# Patient Record
Sex: Male | Born: 1960 | Race: White | Hispanic: No | Marital: Single | Smoking: Never smoker
Health system: Southern US, Community
[De-identification: ages and names within clinical notes are randomized; demographics above are authoritative.]

---

## 2009-09-13 ENCOUNTER — Emergency Department: Payer: Self-pay | Admitting: Emergency Medicine

## 2010-11-23 ENCOUNTER — Emergency Department: Payer: Self-pay | Admitting: Emergency Medicine

## 2010-12-14 ENCOUNTER — Emergency Department: Payer: Self-pay | Admitting: Internal Medicine

## 2011-11-09 ENCOUNTER — Emergency Department: Payer: Self-pay | Admitting: Emergency Medicine

## 2011-11-16 ENCOUNTER — Emergency Department: Payer: Self-pay | Admitting: Emergency Medicine

## 2011-12-07 ENCOUNTER — Emergency Department: Payer: Self-pay | Admitting: Emergency Medicine

## 2012-09-01 IMAGING — CR PELVIS - 1-2 VIEW
1 series · 1 of 1 positions shown · non-contrast
Comparison: none

REASON FOR EXAM: pain rule out foreign body
COMMENTS:   May transport without cardiac monitor

PROCEDURE:     DXR - DXR PELVIS AP ONLY  - November 24, 2010  [DATE]
RESULT:     An AP view of the pelvis shows no significant osseous
abnormalities. No radiopaque foreign body is seen. There is a mild to
moderate amount of fecal material in the colon.

[view not recorded]
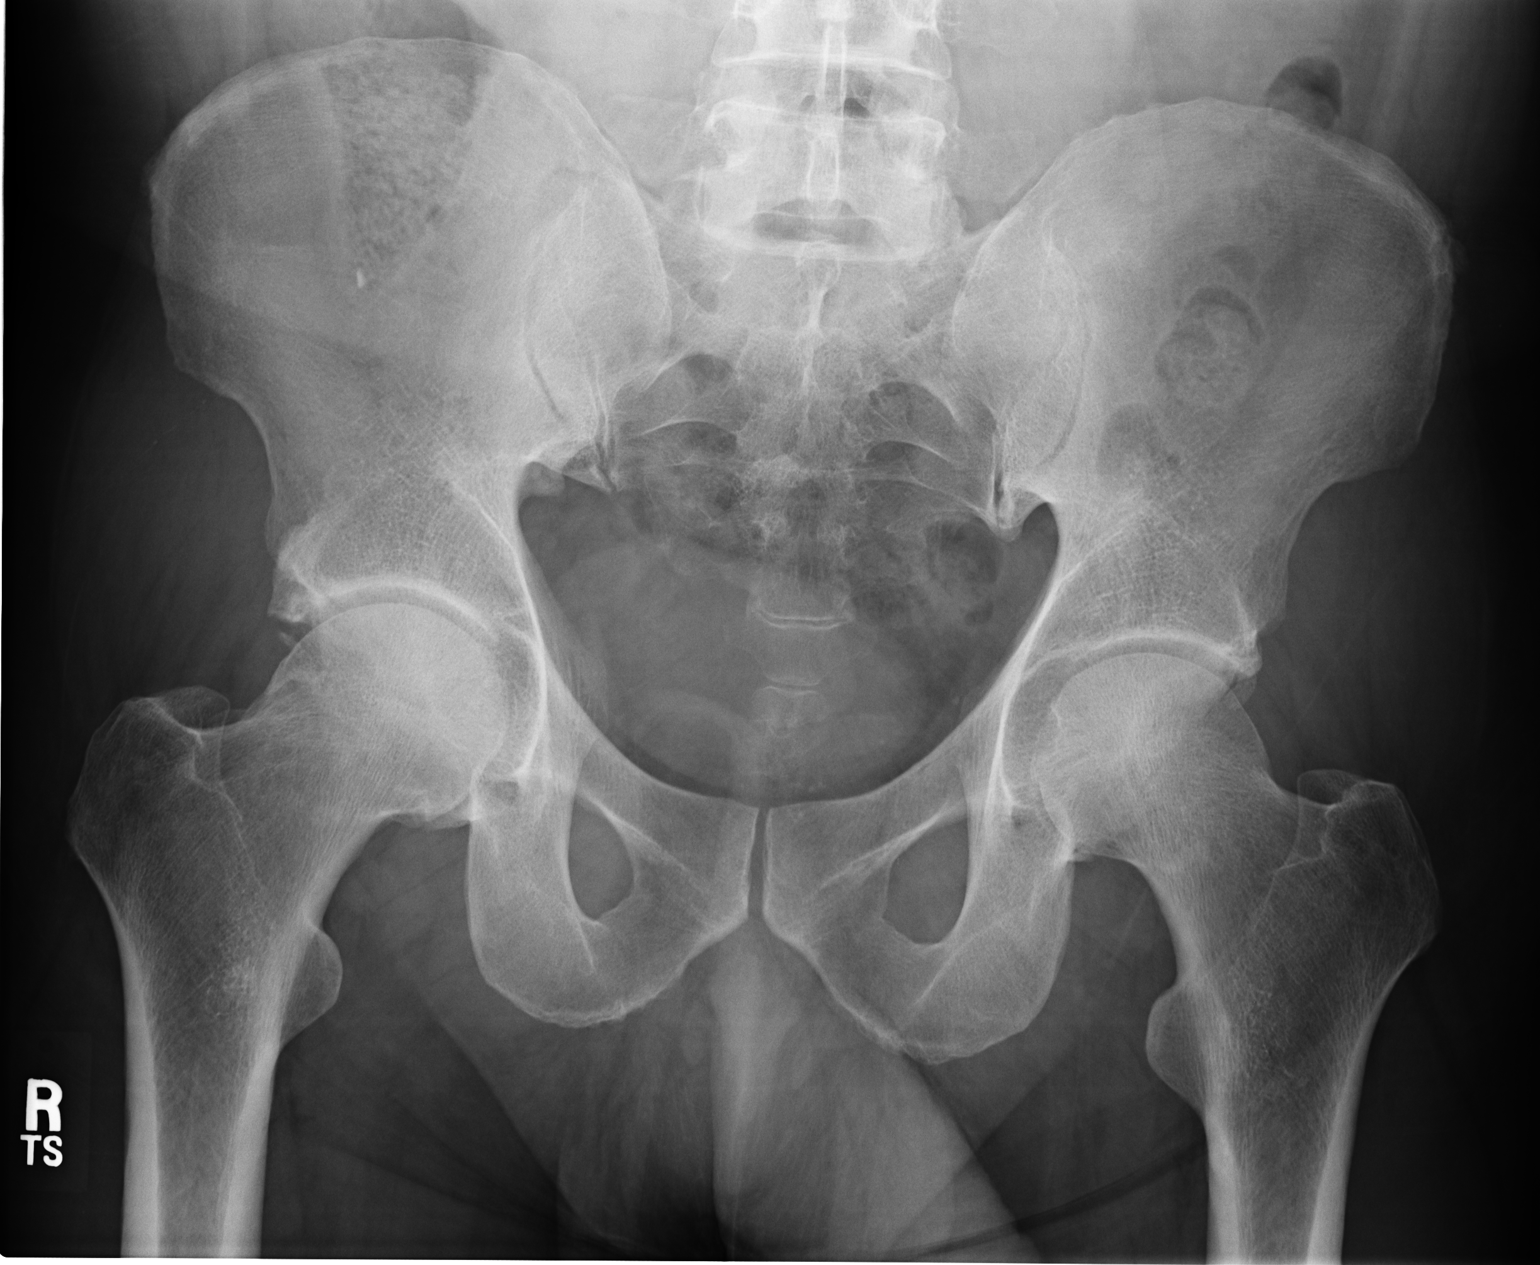

[1 of 1 positions shown; findings below may reference images not displayed]

IMPRESSION: 1.  No foreign body is seen.
2.  No acute changes are identified.

## 2013-07-16 ENCOUNTER — Emergency Department: Payer: Self-pay | Admitting: Emergency Medicine

## 2015-01-26 ENCOUNTER — Encounter: Payer: Self-pay | Admitting: Medical Oncology

## 2015-01-26 ENCOUNTER — Emergency Department: Payer: Self-pay

## 2015-01-26 ENCOUNTER — Emergency Department
Admission: EM | Admit: 2015-01-26 | Discharge: 2015-01-26 | Disposition: A | Discharge status: Home / Self Care | Payer: Self-pay | Attending: Emergency Medicine | Admitting: Emergency Medicine

## 2015-01-26 DIAGNOSIS — W228XXA Striking against or struck by other objects, initial encounter: Secondary | ICD-10-CM | POA: Insufficient documentation

## 2015-01-26 DIAGNOSIS — Y9289 Other specified places as the place of occurrence of the external cause: Secondary | ICD-10-CM | POA: Insufficient documentation

## 2015-01-26 DIAGNOSIS — Y9389 Activity, other specified: Secondary | ICD-10-CM | POA: Insufficient documentation

## 2015-01-26 DIAGNOSIS — Y998 Other external cause status: Secondary | ICD-10-CM | POA: Insufficient documentation

## 2015-01-26 DIAGNOSIS — S8012XA Contusion of left lower leg, initial encounter: Secondary | ICD-10-CM | POA: Insufficient documentation

## 2015-01-26 NOTE — Discharge Instructions (Signed)

## 2015-01-26 NOTE — ED Notes (Signed)
Pt ambulatory to triage with reports that he hit his rt lower leg on a trailer hitch, continues to have swelling to area. Denies pain. Pulse present in foot.

## 2015-01-26 NOTE — ED Notes (Signed)
PA reviewed D/C instructions with patient. Pt left prior to nurse being able to D/C pt or get signature. PA states NAD noted at time of D/C.

## 2015-01-26 NOTE — ED Provider Notes (Signed)
[facility] Emergency Department Provider Note  ____________________________________________  Time seen: Approximately 11:03 AM  I have reviewed the triage vital signs and the nursing notes.   HISTORY  Chief Complaint Leg Injury    HPI Mike Morales is a 54 y.o. male presents with continuous right lower leg discomfort and swelling after hitting it on a trailer hitch approximately 2 weeks ago pain is not significantly is able to work and walk on it without pain there is a a 3 cm nummular area that is raised fluctuant and tender. No ecchymosis or bruising noted no erythema.  History reviewed. No pertinent past medical history.  There are no active problems to display for this patient.   History reviewed. No pertinent past surgical history.  No current outpatient prescriptions on file.  Allergies Review of patient's allergies indicates no known allergies.  No family history on file.  Social History History  Substance Use Topics  . Smoking status: Never Smoker   . Smokeless tobacco: Not on file  . Alcohol Use: Yes     Comment: weekly    Review of Systems Constitutional: No fever/chills Eyes: No visual changes. ENT: No sore throat. Cardiovascular: Denies chest pain. Respiratory: Denies shortness of breath. Gastrointestinal: No abdominal pain.  No nausea, no vomiting.  No diarrhea.  No constipation. Genitourinary: Negative for dysuria. Musculoskeletal: Positive for right lower leg contusion. Skin: Negative for rash. Neurological: Negative for headaches, focal weakness or numbness.  10-point ROS otherwise negative.  ____________________________________________   PHYSICAL EXAM:  VITAL SIGNS: ED Triage Vitals  Enc Vitals Group     BP 01/26/15 1046 133/86 mmHg     Pulse Rate 01/26/15 1046 71     Resp 01/26/15 1046 18     Temp 01/26/15 1046 97.5 F (36.4 C)     Temp Source 01/26/15 1046 Oral     SpO2 01/26/15 1046 97 %     Weight  01/26/15 1046 200 lb (90.719 kg)     Height 01/26/15 1046 6' (1.829 m)     Head Cir --      Peak Flow --      Pain Score --      Pain Loc --      Pain Edu? --      Excl. in GC? --     Constitutional: Alert and oriented. Well appearing and in no acute distress. Eyes: Conjunctivae are normal. PERRL. EOMI. Head: Atraumatic. Nose: No congestion/rhinnorhea. Mouth/Throat: Mucous membranes are moist.  Oropharynx non-erythematous. Neck: No stridor.  Cardiovascular: Normal rate, regular rhythm. Grossly normal heart sounds.  Good peripheral circulation. Respiratory: Normal respiratory effort.  No retractions. Lungs CTAB. Gastrointestinal: Soft and nontender. No distention. No abdominal bruits. No CVA tenderness. Musculoskeletal: Right anterior tib-fib. There is a 3 cm area of raised fluctuant tender mass. Consistent with hemarthrosis. Neurologic:  Normal speech and language. No gross focal neurologic deficits are appreciated. Speech is normal. No gait instability. Skin:  Skin is warm, dry and intact. No rash noted. Psychiatric: Mood and affect are normal. Speech and behavior are normal.  ____________________________________________   LABS (all labs ordered are listed, but only abnormal results are displayed)  Labs Reviewed - No data to display ____________________________________________  EKG Not applicable ____________________________________________  RADIOLOGY  Hematoma interpreted by radiologist reviewed by myself. Negative fracture ____________________________________________   PROCEDURES  Procedure(s) performed: None  Critical Care performed: No  ____________________________________________   INITIAL IMPRESSION / ASSESSMENT AND PLAN / ED COURSE  Pertinent labs &  imaging results that were available during my care of the patient were reviewed by me and considered in my medical decision making (see chart for details).  Reassurance provided to the patient handout given  patient voices understanding and will follow-up if needed. No other treatment needed at this time. ____________________________________________   FINAL CLINICAL IMPRESSION(S) / ED DIAGNOSES  Final diagnoses:  Hematoma of leg, left, initial encounter      Evangeline Dakin, PA-C 01/26/15 1233  Minna Antis, MD 01/26/15 1453

## 2015-02-23 ENCOUNTER — Emergency Department: Payer: Self-pay

## 2015-02-23 ENCOUNTER — Encounter: Payer: Self-pay | Admitting: Emergency Medicine

## 2015-02-23 ENCOUNTER — Emergency Department
Admission: EM | Admit: 2015-02-23 | Discharge: 2015-02-23 | Disposition: A | Discharge status: Home / Self Care | Payer: Self-pay | Attending: Emergency Medicine | Admitting: Emergency Medicine

## 2015-02-23 DIAGNOSIS — M722 Plantar fascial fibromatosis: Secondary | ICD-10-CM | POA: Insufficient documentation

## 2015-02-23 MED ORDER — DICLOFENAC SODIUM 75 MG PO TBEC: 75.0000 mg | DELAYED_RELEASE_TABLET | Freq: Two times a day (BID) | ORAL | Status: DC

## 2015-02-23 NOTE — Discharge Instructions (Signed)
Plantar Fasciitis  Plantar fasciitis is a common condition that causes foot pain. It is soreness (inflammation) of the band of tough fibrous tissue on the bottom of the foot that runs from the heel bone (calcaneus) to the ball of the foot. The cause of this soreness may be from excessive standing, poor fitting shoes, running on hard surfaces, being overweight, having an abnormal walk, or overuse (this is common in runners) of the painful foot or feet. It is also common in aerobic exercise dancers and ballet dancers.  SYMPTOMS   Most people with plantar fasciitis complain of:   Severe pain in the morning on the bottom of their foot especially when taking the first steps out of bed. This pain recedes after a few minutes of walking.   Severe pain is experienced also during walking following a long period of inactivity.   Pain is worse when walking barefoot or up stairs  DIAGNOSIS    Your caregiver will diagnose this condition by examining and feeling your foot.   Special tests such as X-rays of your foot, are usually not needed.  PREVENTION    Consult a sports medicine professional before beginning a new exercise program.   Walking programs offer a good workout. With walking there is a lower chance of overuse injuries common to runners. There is less impact and less jarring of the joints.   Begin all new exercise programs slowly. If problems or pain develop, decrease the amount of time or distance until you are at a comfortable level.   Wear good shoes and replace them regularly.   Stretch your foot and the heel cords at the back of the ankle (Achilles tendon) both before and after exercise.   Run or exercise on even surfaces that are not hard. For example, asphalt is better than pavement.   Do not run barefoot on hard surfaces.   If using a treadmill, vary the incline.   Do not continue to workout if you have foot or joint problems. Seek professional help if they do not improve.  HOME CARE INSTRUCTIONS     Avoid activities that cause you pain until you recover.   Use ice or cold packs on the problem or painful areas after working out.   Only take over-the-counter or prescription medicines for pain, discomfort, or fever as directed by your caregiver.   Soft shoe inserts or athletic shoes with air or gel sole cushions may be helpful.   If problems continue or become more severe, consult a sports medicine caregiver or your own health care provider. Cortisone is a potent anti-inflammatory medication that may be injected into the painful area. You can discuss this treatment with your caregiver.  MAKE SURE YOU:    Understand these instructions.   Will watch your condition.   Will get help right away if you are not doing well or get worse.  Document Released: 03/30/2001 Document Revised: 09/27/2011 Document Reviewed: 05/29/2008  ExitCare Patient Information 2015 [employer]. This information is not intended to replace advice given to you by your health care provider. Make sure you discuss any questions you have with your health care provider.

## 2015-02-23 NOTE — ED Provider Notes (Signed)
[facility] Emergency Department Provider Note  ____________________________________________  Time seen:  12:21 PM  I have reviewed the triage vital signs and the nursing notes.   HISTORY  Chief Complaint Foot Pain  HPI Mike Morales is a 54 y.o. male is here with complaint of left foot pain 1 month. He states when he wakes up in the mornings it is extremely painful and gets a little bit better during the day. He states he does a lot of walking during the day and after several hours the pain is again severe. He denies any injury to his foot in the past. He has no history of gout. He has been taking BC powders without any relief. Pain is generally located in the heel and radiates  to the arch of his foot.He is able to sleep at night without any pain. Currently his pain is 10 out of 10.  History reviewed. No pertinent past medical history.  There are no active problems to display for this patient.   History reviewed. No pertinent past surgical history.  Current Outpatient Rx  Name  Route  Sig  Dispense  Refill  . diclofenac (VOLTAREN) 75 MG EC tablet   Oral   Take 1 tablet (75 mg total) by mouth 2 (two) times daily.   30 tablet   0     Allergies Review of patient's allergies indicates no known allergies.  No family history on file.  Social History History  Substance Use Topics  . Smoking status: Never Smoker   . Smokeless tobacco: Not on file  . Alcohol Use: Yes     Comment: weekly    Review of Systems Constitutional: No fever/chills ENT: No sore throat. Cardiovascular: Denies chest pain. Respiratory: Denies shortness of breath. Gastrointestinal: No abdominal pain.  No nausea, no vomiting. Musculoskeletal: Negative for back pain. Skin: Negative for rash. Neurological: Negative for headaches, focal weakness or numbness.  10-point ROS otherwise negative.  ____________________________________________   PHYSICAL EXAM:  VITAL  SIGNS: ED Triage Vitals  Enc Vitals Group     BP 02/23/15 1111 142/91 mmHg     Pulse Rate 02/23/15 1111 77     Resp 02/23/15 1111 20     Temp 02/23/15 1111 98.4 F (36.9 C)     Temp src --      SpO2 02/23/15 1111 96 %     Weight 02/23/15 1111 230 lb (104.327 kg)     Height 02/23/15 1111 6' (1.829 m)     Head Cir --      Peak Flow --      Pain Score 02/23/15 1111 10     Pain Loc --      Pain Edu? --      Excl. in GC? --     Constitutional: Alert and oriented. Well appearing and in no acute distress. Eyes: Conjunctivae are normal. PERRL. EOMI. Head: Atraumatic. Nose: No congestion/rhinnorhea. Neck: No stridor.   Cardiovascular: Normal rate, regular rhythm. Grossly normal heart sounds.  Good peripheral circulation. Respiratory: Normal respiratory effort.  No retractions. Lungs CTAB. Gastrointestinal: Soft and nontender. No distention. No abdominal bruits. No CVA tenderness. Musculoskeletal: Left foot without deformity. There is moderate tenderness on palpation of the calcaneal area and arch. Skin is intact. Digits. Be normal and range of motion is without restriction.  No lower extremity tenderness nor edema.  No joint effusions. Neurologic:  Normal speech and language. No gross focal neurologic deficits are appreciated. No gait instability. Skin:  Skin is warm, dry and intact. No rash noted. No abrasions or redness seen. Psychiatric: Mood and affect are normal. Speech and behavior are normal.  ____________________________________________   LABS (all labs ordered are listed, but only abnormal results are displayed)  Labs Reviewed - No data to display   RADIOLOGY  Left foot x-ray per radiologist and reviewed by me showed no fracture dislocation of the left foot. There is a tiny heel spur present. I, Tommi Rumps, personally viewed and evaluated these images as part of my medical decision making.  ____________________________________________   PROCEDURES  Procedure(s)  performed: None  Critical Care performed: No  ____________________________________________   INITIAL IMPRESSION / ASSESSMENT AND PLAN / ED COURSE  Pertinent labs & imaging results that were available during my care of the patient were reviewed by me and considered in my medical decision making (see chart for details  patient is to follow-up with Dr. Ether Griffins if any continued problems. He is started on the car and 75 mg 1 twice a day with food. He is to discontinue taking BC powders.    FINAL CLINICAL IMPRESSION(S) / ED DIAGNOSES  Final diagnoses:  Plantar fasciitis of left foot      Tommi Rumps, PA-C 02/23/15 1343  Myrna Blazer, MD 02/23/15 [phone]

## 2015-02-23 NOTE — ED Notes (Signed)
Patient presents to the ED with left foot pain x 1 month.  Patient states pain is worse when he wakes up in the morning.  Patient states pain is also fairly severe when he walks for a few hours.  Patient states pain is mostly in his heel.  Patient denies any memory of any injury.  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

## 2015-02-23 NOTE — ED Notes (Signed)
Pt verbalizes understanding of discharged instructions  

## 2015-03-23 ENCOUNTER — Encounter: Payer: Self-pay | Admitting: Medical Oncology

## 2015-03-23 ENCOUNTER — Emergency Department
Admission: EM | Admit: 2015-03-23 | Discharge: 2015-03-23 | Disposition: A | Discharge status: Home / Self Care | Payer: Self-pay | Attending: Emergency Medicine | Admitting: Emergency Medicine

## 2015-03-23 DIAGNOSIS — M722 Plantar fascial fibromatosis: Secondary | ICD-10-CM | POA: Insufficient documentation

## 2015-03-23 MED ORDER — KETOROLAC TROMETHAMINE 60 MG/2ML IM SOLN
60.0000 mg | Freq: Once | INTRAMUSCULAR | Status: AC
  Administered 2015-03-23: 60 mg via INTRAMUSCULAR
  Filled 2015-03-23: qty 2

## 2015-03-23 MED ORDER — DICLOFENAC SODIUM 75 MG PO TBEC: 75.0000 mg | DELAYED_RELEASE_TABLET | Freq: Two times a day (BID) | ORAL | Status: AC

## 2015-03-23 MED ORDER — PREDNISONE 10 MG PO TABS: 50.0000 mg | ORAL_TABLET | Freq: Every day | ORAL | Status: AC

## 2015-03-23 NOTE — ED Provider Notes (Signed)
[facility] Emergency Department Provider Note  ____________________________________________  Time seen: Approximately 11:24 AM  I have reviewed the triage vital signs and the nursing notes.   HISTORY  Chief Complaint Foot Pain  Patient reports continuous left foot pain. States he was seen here approximately one month ago for the same diagnosed with plantar fasciitis. States medication did help initially but once he ran a medication the pain is continuing. Pain is worse in the mornings and slowly improves during the day. Did not see podiatry secondary to cost.  HPI Mike Morales is a 54 y.o. male presents for above complaints.   History reviewed. No pertinent past medical history.  There are no active problems to display for this patient.   History reviewed. No pertinent past surgical history.  Current Outpatient Rx  Name  Route  Sig  Dispense  Refill  . diclofenac (VOLTAREN) 75 MG EC tablet   Oral   Take 1 tablet (75 mg total) by mouth 2 (two) times daily.   60 tablet   1   . predniSONE (DELTASONE) 10 MG tablet   Oral   Take 5 tablets (50 mg total) by mouth daily with breakfast.   25 tablet   0     Allergies Review of patient's allergies indicates no known allergies.  No family history on file.  Social History Social History  Substance Use Topics  . Smoking status: Never Smoker   . Smokeless tobacco: None  . Alcohol Use: Yes     Comment: weekly    Review of Systems Constitutional: No fever/chills Eyes: No visual changes. ENT: No sore throat. Cardiovascular: Denies chest pain. Respiratory: Denies shortness of breath. Gastrointestinal: No abdominal pain.  No nausea, no vomiting.  No diarrhea.  No constipation. Genitourinary: Negative for dysuria. Musculoskeletal: Positive for left foot pain. Skin: Negative for rash. Neurological: Negative for headaches, focal weakness or numbness.  10-point ROS otherwise  negative.  ____________________________________________   PHYSICAL EXAM:  VITAL SIGNS: ED Triage Vitals  Enc Vitals Group     BP 03/23/15 1056 140/98 mmHg     Pulse Rate 03/23/15 1056 74     Resp 03/23/15 1056 18     Temp 03/23/15 1056 97.7 F (36.5 C)     Temp Source 03/23/15 1056 Oral     SpO2 03/23/15 1056 96 %     Weight 03/23/15 1056 230 lb (104.327 kg)     Height 03/23/15 1056 6' (1.829 m)     Head Cir --      Peak Flow --      Pain Score 03/23/15 1056 10     Pain Loc --      Pain Edu? --      Excl. in GC? --     Constitutional: Alert and oriented. Well appearing and in no acute distress. Musculoskeletal: Left foot without deformity. Positive neurovascularly intact distally. Full range of motion without restriction. Neurologic:  Normal speech and language. No gross focal neurologic deficits are appreciated. No gait instability. Skin:  Skin is warm, dry and intact. No rash noted. Psychiatric: Mood and affect are normal. Speech and behavior are normal.  ____________________________________________   LABS (all labs ordered are listed, but only abnormal results are displayed)  Labs Reviewed - No data to display ____________________________________________  PROCEDURES  Procedure(s) performed: None  Critical Care performed: No  ____________________________________________   INITIAL IMPRESSION / ASSESSMENT AND PLAN / ED COURSE  Pertinent labs & imaging results that were available  during my care of the patient were reviewed by me and considered in my medical decision making (see chart for details).  Reviewed previous office visit. Will continue with diagnosis of plantar fasciitis. Rx given for Voltaren 35 mg twice a day #60. Prednisone 60 mg daily 5 days. Patient follow-up with PCP or return to the ER continuing or worsening symptomology. ____________________________________________   FINAL CLINICAL IMPRESSION(S) / ED DIAGNOSES  Final diagnoses:  Plantar  fasciitis of left foot      Evangeline Dakin, PA-C 03/23/15 1130  Myrna Blazer, MD 03/23/15 [phone]

## 2015-03-23 NOTE — ED Notes (Signed)
Pt was seen here on 8/7 and diagnosed with plantar fascitis, pt reports pain to left foot not improving.

## 2015-03-23 NOTE — ED Notes (Signed)
States he was seen about 3 weeks ago for foot pain .Marland Kitchenstates pain is worse in the am's denies recent injury and ran out of meds

## 2015-03-23 NOTE — Discharge Instructions (Signed)
Plantar Fasciitis  Plantar fasciitis is a common condition that causes foot pain. It is soreness (inflammation) of the band of tough fibrous tissue on the bottom of the foot that runs from the heel bone (calcaneus) to the ball of the foot. The cause of this soreness may be from excessive standing, poor fitting shoes, running on hard surfaces, being overweight, having an abnormal walk, or overuse (this is common in runners) of the painful foot or feet. It is also common in aerobic exercise dancers and ballet dancers.  SYMPTOMS   Most people with plantar fasciitis complain of:   Severe pain in the morning on the bottom of their foot especially when taking the first steps out of bed. This pain recedes after a few minutes of walking.   Severe pain is experienced also during walking following a long period of inactivity.   Pain is worse when walking barefoot or up stairs  DIAGNOSIS    Your caregiver will diagnose this condition by examining and feeling your foot.   Special tests such as X-rays of your foot, are usually not needed.  PREVENTION    Consult a sports medicine professional before beginning a new exercise program.   Walking programs offer a good workout. With walking there is a lower chance of overuse injuries common to runners. There is less impact and less jarring of the joints.   Begin all new exercise programs slowly. If problems or pain develop, decrease the amount of time or distance until you are at a comfortable level.   Wear good shoes and replace them regularly.   Stretch your foot and the heel cords at the back of the ankle (Achilles tendon) both before and after exercise.   Run or exercise on even surfaces that are not hard. For example, asphalt is better than pavement.   Do not run barefoot on hard surfaces.   If using a treadmill, vary the incline.   Do not continue to workout if you have foot or joint problems. Seek professional help if they do not improve.  HOME CARE INSTRUCTIONS     Avoid activities that cause you pain until you recover.   Use ice or cold packs on the problem or painful areas after working out.   Only take over-the-counter or prescription medicines for pain, discomfort, or fever as directed by your caregiver.   Soft shoe inserts or athletic shoes with air or gel sole cushions may be helpful.   If problems continue or become more severe, consult a sports medicine caregiver or your own health care provider. Cortisone is a potent anti-inflammatory medication that may be injected into the painful area. You can discuss this treatment with your caregiver.  MAKE SURE YOU:    Understand these instructions.   Will watch your condition.   Will get help right away if you are not doing well or get worse.  Document Released: 03/30/2001 Document Revised: 09/27/2011 Document Reviewed: 05/29/2008  ExitCare Patient Information 2015 [employer]. This information is not intended to replace advice given to you by your health care provider. Make sure you discuss any questions you have with your health care provider.

## 2016-12-01 IMAGING — CR DG FOOT COMPLETE 3+V*L*
1 series · 3 of 3 positions shown · non-contrast
Comparison: None.

CLINICAL DATA: Left foot and heel pain, no known trauma

EXAM:
LEFT FOOT - COMPLETE 3+ VIEW

[Series 5: x foot ap left · 0.14mm/px · 3 of 3 slices shown]
[im 1/3]
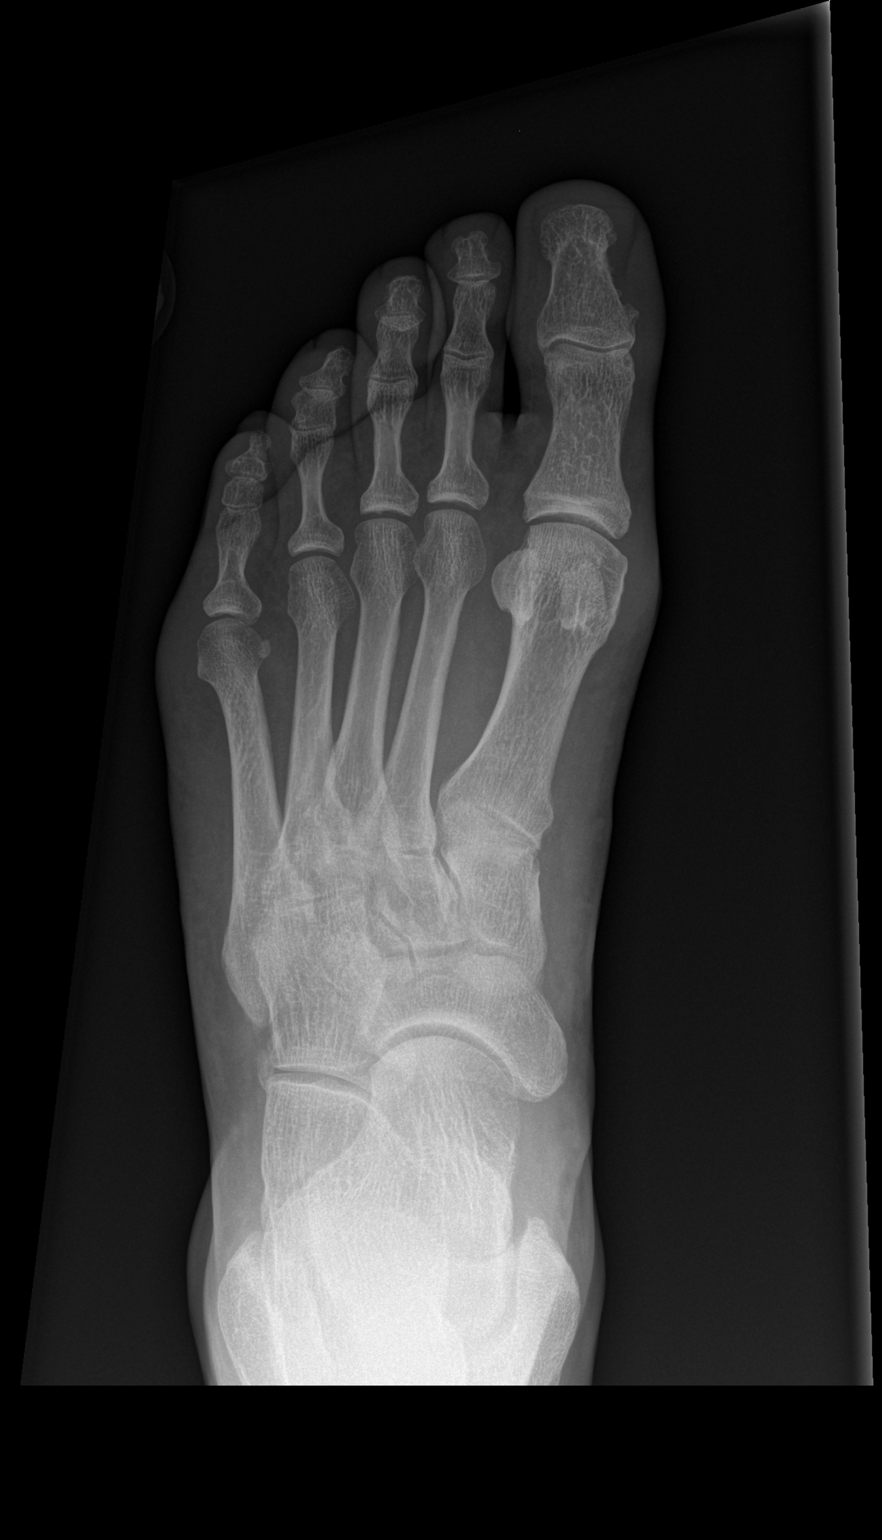
[im 2/3]
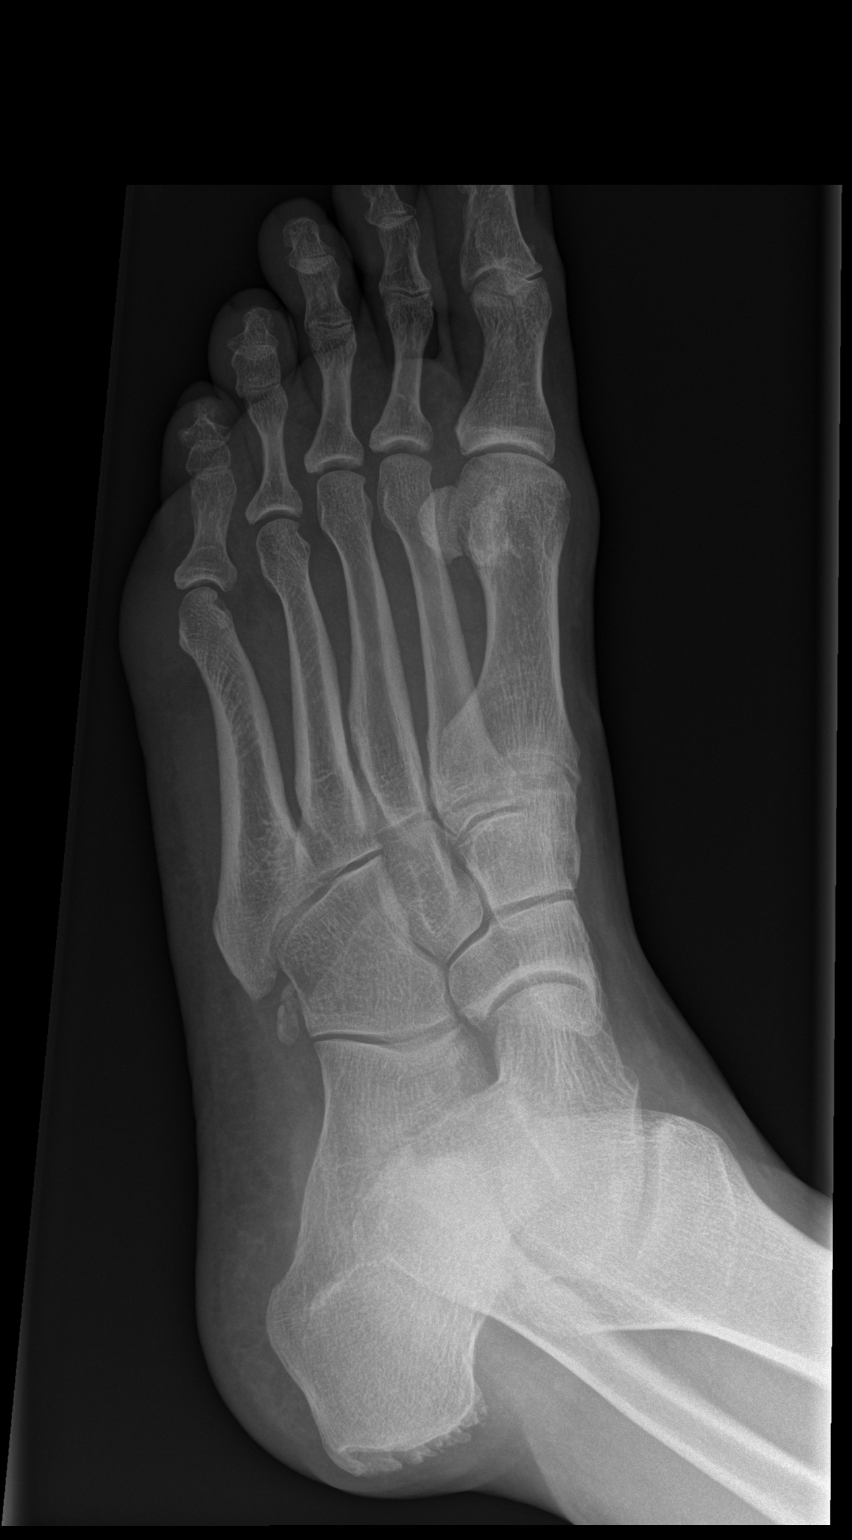
[im 3/3]
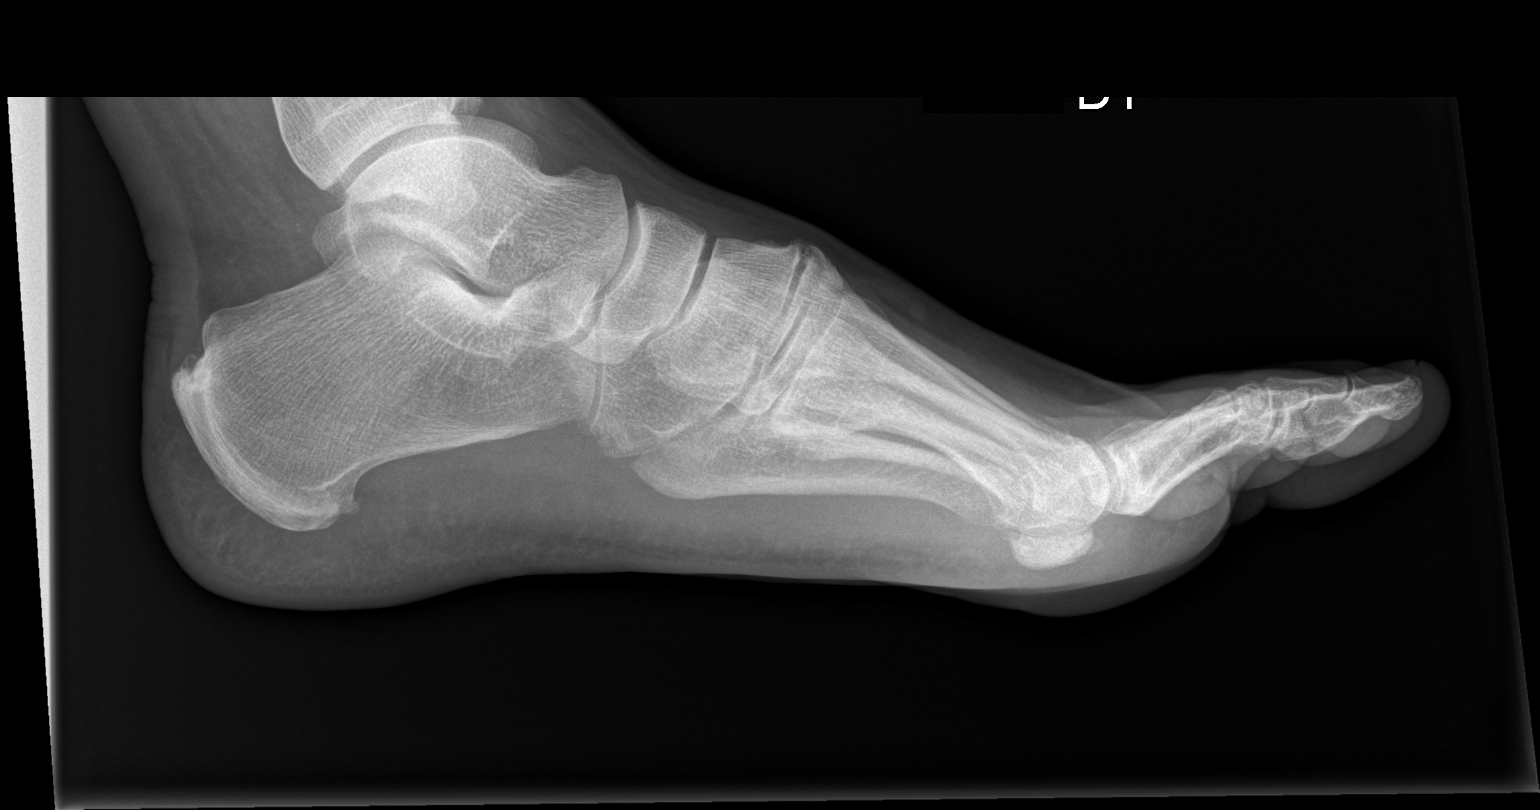

[3 of 3 positions shown; findings below may reference images not displayed]

FINDINGS: No fracture or dislocation.  Tiny heel spur.
IMPRESSION: No acute findings

## 2016-12-31 ENCOUNTER — Emergency Department
Admission: EM | Admit: 2016-12-31 | Discharge: 2016-12-31 | Disposition: A | Discharge status: Home / Self Care | Payer: Self-pay | Attending: Emergency Medicine | Admitting: Emergency Medicine

## 2016-12-31 ENCOUNTER — Encounter: Payer: Self-pay | Admitting: Medical Oncology

## 2016-12-31 ENCOUNTER — Emergency Department: Payer: Self-pay

## 2016-12-31 DIAGNOSIS — Z791 Long term (current) use of non-steroidal anti-inflammatories (NSAID): Secondary | ICD-10-CM | POA: Insufficient documentation

## 2016-12-31 DIAGNOSIS — Z7952 Long term (current) use of systemic steroids: Secondary | ICD-10-CM | POA: Insufficient documentation

## 2016-12-31 DIAGNOSIS — M19071 Primary osteoarthritis, right ankle and foot: Secondary | ICD-10-CM | POA: Insufficient documentation

## 2016-12-31 MED ORDER — MELOXICAM 15 MG PO TABS: 15.0000 mg | ORAL_TABLET | Freq: Every day | ORAL | 0 refills | Status: AC

## 2016-12-31 MED ORDER — OXYCODONE-ACETAMINOPHEN 7.5-325 MG PO TABS: 1.0000 | ORAL_TABLET | Freq: Four times a day (QID) | ORAL | 0 refills | Status: AC | PRN

## 2016-12-31 NOTE — ED Provider Notes (Signed)
[facility] Emergency Department Provider Note   ____________________________________________   First MD Initiated Contact with Patient 12/31/16 [phone]     (approximate)  I have reviewed the triage vital signs and the nursing notes.   HISTORY  Chief Complaint Toe Pain    HPI Mike Morales is a 56 y.o. male  For 6 months. Patient stating one year ago he dropped something on his toe but never had it evaluated. Patient states pain and edema increases with prolonged standing which is required at work. Patient rates his pain as 8/10. Patient described a pain as a burning tingling sensation. No palliative measures for complaint.   History reviewed. No pertinent past medical history.  There are no active problems to display for this patient.   No past surgical history on file.  Prior to Admission medications   Medication Sig Start Date End Date Taking? Authorizing Provider  diclofenac (VOLTAREN) 75 MG EC tablet Take 1 tablet (75 mg total) by mouth 2 (two) times daily. 03/23/15   Beers, Charmayne Sheer, PA-C  meloxicam (MOBIC) 15 MG tablet Take 1 tablet (15 mg total) by mouth daily. 12/31/16   Joni Reining, PA-C  oxyCODONE-acetaminophen (PERCOCET) 7.5-325 MG tablet Take 1 tablet by mouth every 6 (six) hours as needed for severe pain. 12/31/16   Joni Reining, PA-C  predniSONE (DELTASONE) 10 MG tablet Take 5 tablets (50 mg total) by mouth daily with breakfast. 03/23/15   Beers, Charmayne Sheer, PA-C    Allergies Patient has no known allergies.  No family history on file.  Social History Social History  Substance Use Topics  . Smoking status: Never Smoker  . Smokeless tobacco: Not on file  . Alcohol use Yes     Comment: weekly    Review of Systems  Constitutional: No fever/chills Eyes: No visual changes. ENT: No sore throat. Cardiovascular: Denies chest pain. Respiratory: Denies shortness of breath. Gastrointestinal: No abdominal pain.  No nausea, no  vomiting.  No diarrhea.  No constipation. Genitourinary: Negative for dysuria. Musculoskeletal: Right toe pain Skin: Negative for rash. Neurological: Negative for headaches, focal weakness or numbness.   ____________________________________________   PHYSICAL EXAM:  VITAL SIGNS: ED Triage Vitals  Enc Vitals Group     BP 12/31/16 0735 126/80     Pulse Rate 12/31/16 0735 71     Resp 12/31/16 0735 16     Temp 12/31/16 0735 97.8 F (36.6 C)     Temp Source 12/31/16 0735 Oral     SpO2 12/31/16 0735 96 %     Weight 12/31/16 0736 230 lb (104.3 kg)     Height 12/31/16 0736 6' (1.829 m)     Head Circumference --      Peak Flow --      Pain Score 12/31/16 0735 8     Pain Loc --      Pain Edu? --      Excl. in GC? --     Constitutional: Alert and oriented. Well appearing and in no acute distress. Cardiovascular: Normal rate, regular rhythm. Grossly normal heart sounds.  Good peripheral circulation. Respiratory: Normal respiratory effort.  No retractions. Lungs CTAB. **}Musculoskeletal: No lower extremity tenderness nor edema.  No joint effusions. Neurologic:  Normal speech and language. No gross focal neurologic deficits are appreciated. No gait instability. Skin:  Skin is warm, dry and intact. No rash noted. Psychiatric: Mood and affect are normal. Speech and behavior are normal.  ____________________________________________   LABS (all  labs ordered are listed, but only abnormal results are displayed)  Labs Reviewed - No data to display ____________________________________________  EKG   ____________________________________________  RADIOLOGY  Dg Toe Great Right  Result Date: 12/31/2016 CLINICAL DATA:  Crush injury 1 year ago with persistent pain of the distal toe. EXAM: RIGHT GREAT TOE COMPARISON:  None. FINDINGS: There are is advanced osteoarthritis of the inter phalangeal joint of the toe with joint space loss and marginal osteophytes. No sign of nonunited fracture.  Mild to moderate degenerative changes present at the MTP joint of the toe. IMPRESSION: Osteoarthritic changes of the great toe, more advanced at the inter phalangeal joint than the metatarsal phalangeal joint. Electronically Signed   By: Paulina FusiMark  Shogry M.D.   On: 12/31/2016 08:18   X-ray reveal arthritic changes in the first digit right foot. ____________________________________________   PROCEDURES  Procedure(s) performed: None  Procedures  Critical Care performed: No  ____________________________________________   INITIAL IMPRESSION / ASSESSMENT AND PLAN / ED COURSE  Pertinent labs & imaging results that were available during my care of the patient were reviewed by me and considered in my medical decision making (see chart for details).  Right great toe pain secondary to osteoarthritis. Discussed x-ray finding with patient. Patient given discharge care instructions. Patient advised [facility].      ____________________________________________   FINAL CLINICAL IMPRESSION(S) / ED DIAGNOSES  Final diagnoses:  Osteoarthritis of toe joint, right      NEW MEDICATIONS STARTED DURING THIS VISIT:  New Prescriptions   MELOXICAM (MOBIC) 15 MG TABLET    Take 1 tablet (15 mg total) by mouth daily.   OXYCODONE-ACETAMINOPHEN (PERCOCET) 7.5-325 MG TABLET    Take 1 tablet by mouth every 6 (six) hours as needed for severe pain.     Note:  This document was prepared using Dragon voice recognition software and may include unintentional dictation errors.    Joni ReiningSmith, Charnise Lovan K, PA-C 12/31/16 [phone]    Minna AntisPaduchowski, Kevin, MD 12/31/16 1455

## 2016-12-31 NOTE — ED Triage Notes (Signed)
Pt reports that he dropped something on his toe 1 year ago, since then has had continued pain to rt great toe.

## 2018-10-09 IMAGING — CR DG TOE GREAT 2+V*R*
1 series · 3 of 3 positions shown · non-contrast
Comparison: None.

CLINICAL DATA: Crush injury 1 year ago with persistent pain of the
distal toe.

EXAM:
RIGHT GREAT TOE

[Series 1: dg toe great right · 0.14mm/px · 3 of 3 slices shown]
[im 1/3]
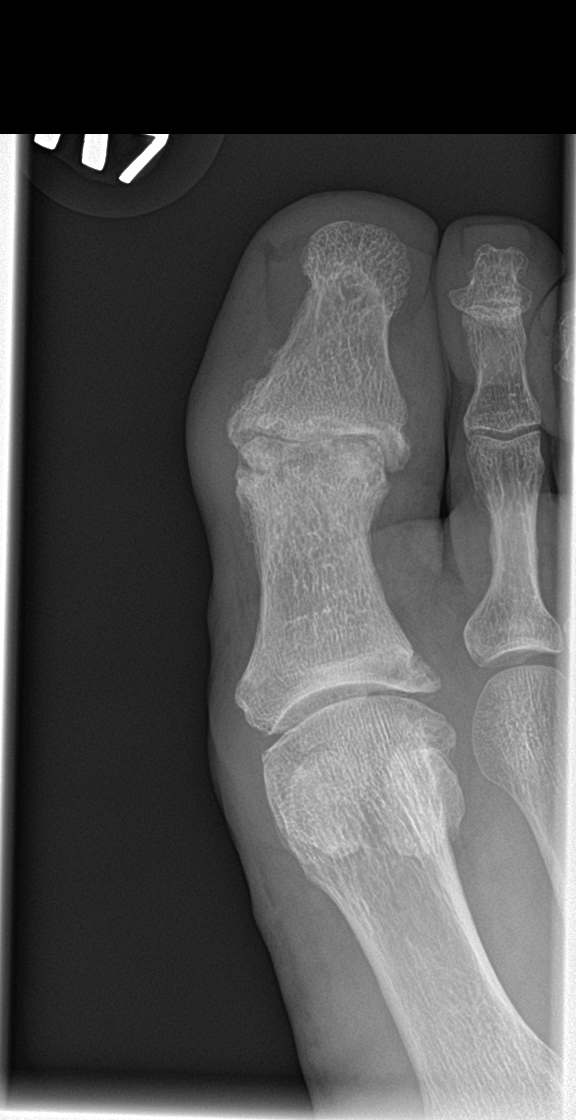
[im 2/3]
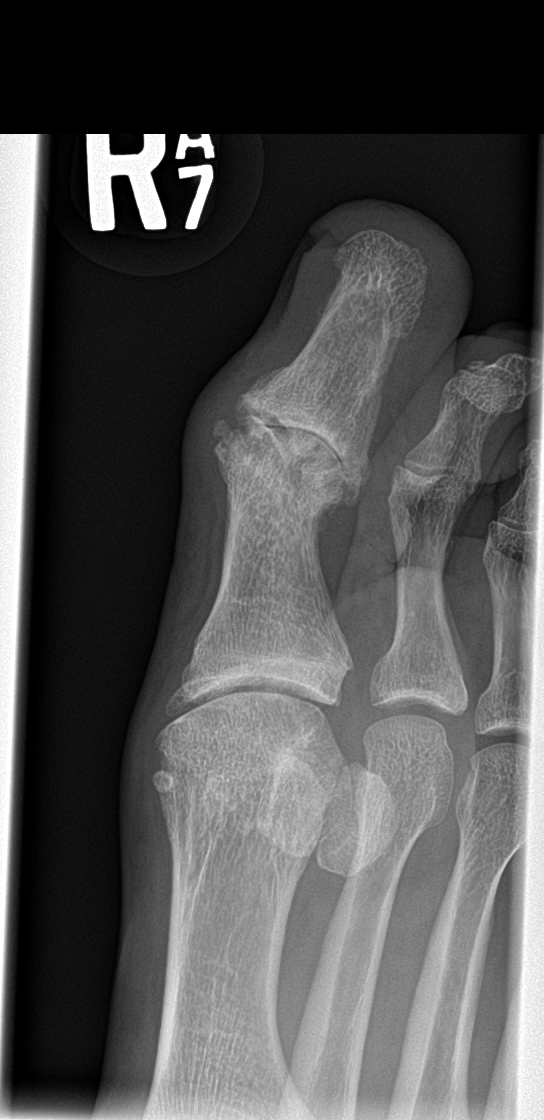
[im 3/3]
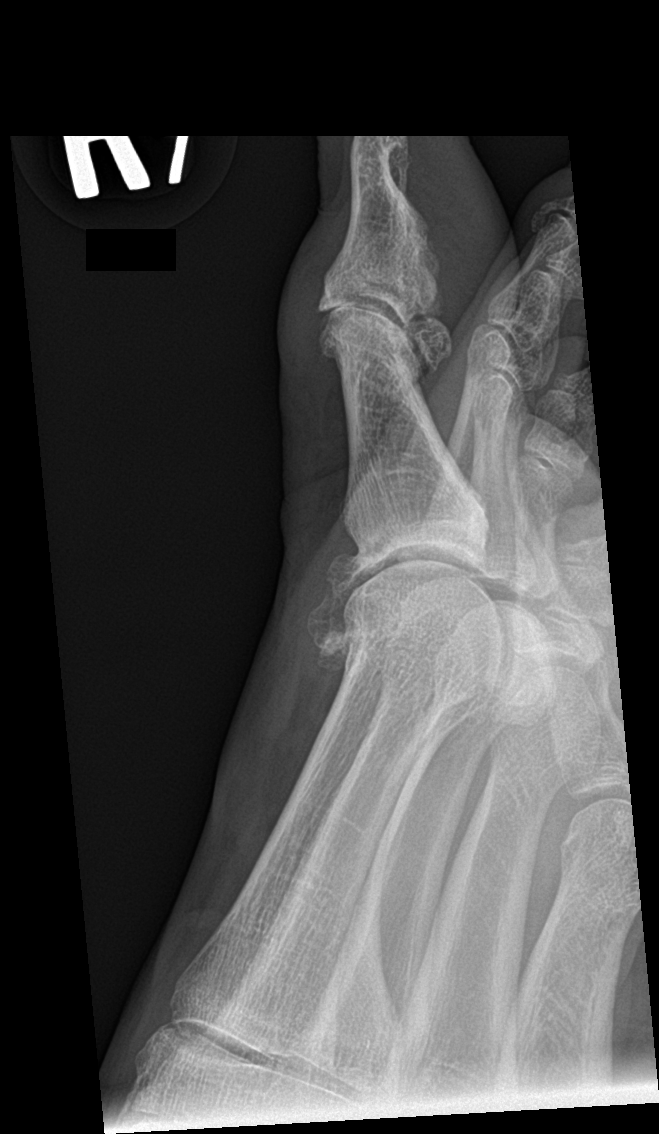

[3 of 3 positions shown; findings below may reference images not displayed]

FINDINGS: There are is advanced osteoarthritis of the inter phalangeal joint
of the toe with joint space loss and marginal osteophytes. No sign
of nonunited fracture. Mild to moderate degenerative changes present
at the MTP joint of the toe.
IMPRESSION: Osteoarthritic changes of the great toe, more advanced at the inter
phalangeal joint than the metatarsal phalangeal joint.
